# Patient Record
Sex: Male | Born: 1990 | Race: Black or African American | Hispanic: No | Smoking: Never smoker
Health system: Southern US, Community
[De-identification: ages and names within clinical notes are randomized; demographics above are authoritative.]

## PROBLEM LIST (undated history)

## (undated) DIAGNOSIS — B1910 Unspecified viral hepatitis B without hepatic coma: Secondary | ICD-10-CM

## (undated) HISTORY — PX: HIP SURGERY: SHX245

---
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
## 2009-10-17 ENCOUNTER — Emergency Department (HOSPITAL_COMMUNITY): Admission: EM | Admit: 2009-10-17 | Discharge: 2009-10-17 | Payer: Self-pay | Admitting: Emergency Medicine

## 2010-07-08 NOTE — Op Note (Signed)
Ernest Moore, Ernest Moore               ACCOUNT NO.:  [id number]   MEDICAL RECORD NO.:  [id number]          PATIENT TYPE:  INP   LOCATION:  6126                         FACILITY:  [facility]   PHYSICIAN:  Lubertha Basque. Dalldorf, M.D.DATE OF BIRTH:  01/18/1991   DATE OF PROCEDURE:  11/25/2003  DATE OF DISCHARGE:                                 OPERATIVE REPORT   PREOPERATIVE DIAGNOSIS:  Right hip infection.   POSTOPERATIVE DIAGNOSIS:  Right hip infection.   PROCEDURE:  Right hip irrigation and debridement.   ANESTHESIA:  General.   ATTENDING SURGEON:  Lubertha Basque. Jerl Santos, M.D.   ASSISTANT:  Lindwood Qua, P.A.   INDICATION FOR PROCEDURE:  The patient is a 20 year old boy who immigrated  from [place] about one year ago.  He presented yesterday with a 1 day history  of severe hip pain.  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  I attempted an aspiration but was unsuccessful.  He is scheduled  now for irrigation and debridement and biopsy of this area.  Informed  operative consent was obtained from the patient's father after discussion of  possible complications and reaction to anesthesia, continued infection, and  neurovascular injury.  The possibility this might represent a neoplastic  process was also discussed.   DESCRIPTION OF PROCEDURE:  The patient was taken to the operating suite  where general anesthetic was applied without difficulty.  He was positioned  in the lateral decubitus position with the right hip up.  All bony  prominences were appropriately padded, an axillary roll was placed, and hip  positioners were utilized.  He was then prepped and draped in the normal  sterile fashion.  A lateral approach was taken to the right hip.  A  longitudinal incision was made just anterior to the greater trochanter.  Dissection was  carried down to the gluteus medius and IT band through which  a longitudinal incision was made.  These structures were retracted in an  anterior direction.  We also went through the gluteus minimus to expose the  hip capsule.  The capsule was then released off of the femoral neck and a  large amount of tan fluid emanated.  This was cultured and sent to  microbiology.  We then exposed the cyst on the anteroinferior aspect of the  femoral head.  With a curet I was able to remove the sequestrum and this was  sent to pathology.  The wound was then irrigated with pulsatile lavage and a  syringe, running several liters of fluid through the joint.  A large Hemovac  drain was placed exiting distally.  The capsule was repaired loosely with  Vicryl followed by reapproximation of the gluteus minimus and the fascial  layer with #1 Vicryl in interrupted fashion.  Subcutaneous tissue was  reapproximated with 2-0 undyed Vicryl and skin was closed with staples.  Adaptic was applied to the wound after some  Marcaine was injected.  Dry  gauze and tape were applied.  Estimated __________ was taken from anesthesia  records.   DISPOSITION:  The patient was extubated in the operating room and taken to  the recovery room in stable condition.  Plans were for him to be admitted  back to the pediatric service.  He received some IV Ancef after cultures had  been taken and he will continue on his antibiotic as we monitor the culture  results.       PGD/MEDQ  D:  11/25/2003  T:  11/25/2003  Job:  [id number]

## 2010-07-08 NOTE — Discharge Summary (Signed)
NAMECAS, TRACZ               ACCOUNT NO.:  [id number]   MEDICAL RECORD NO.:  [id number]          PATIENT TYPE:  INP   LOCATION:  6126                         FACILITY:  [facility]   PHYSICIAN:  Orie Rout, M.D.DATE OF BIRTH:  [birth year]   DATE OF ADMISSION:  11/24/2003  DATE OF DISCHARGE:  12/01/2003                                 DISCHARGE SUMMARY   PRIMARY CARE PHYSICIAN:  Dr. Lubertha South at [facility]   DISCHARGE DIAGNOSES:  Tuberculosis osteomyelitis.   PROCEDURES:  1.  I&D of proximal right femur on November 25, 2003.  2.  Hip film on November 24, 2003 shows a lytic lesion in the metaphysis of      the proximal right femur.  3.  CT scan of the right hip on November 24, 2003 shows a lytic lesion in the      right femoral metaphysis with destruction of the cortex consistent with      possible Buddy's abscess.   LABORATORIES:  AF[facility] culture with smear.  No acid-fast bacilli seen.  However,  sensitivities are pending at this time.  A right hip aspirate from the joint  grew coag-negative Staph consistent with contamination.  Operative cultures  of the right hip show no organisms.  Blood cultures grew no organisms.  Urine cultures grew no organisms.  There is an HIV test pending at the time  of discharge.   HISTORY AND PHYSICAL:  For complete history and physical, please see the  documented  H&P on the chart, but in short, patient is a 20 year old African-  American gentleman who presented with acute onset of right hip pain.  Was  brought to her primary care physician who referred her to the emergency  department where an x-ray revealed a lytic lesion in the proximal right  femur confirmed by CT.  Orthopedics was consulted and an I&D of the right  hip was performed on November 30, 2003.   HOSPITAL COURSE:  I&D was performed on November 30, 2003 and the hip fluid  was sent for cultures.  Also, blood cultures and urine cultures were sent.  These were all negative at the time  of discharge.  However, there is an AFB  hip culture still pending and will be official in six weeks.  Surgical  pathology specimens were sent which showed caseating granulomatous  inflammation, positive acid-fast bacilli seen.  This was consistent with TB  osteomyelitis.  The TB nurse was contacted and the patient was started on  rifampin, isoniazid, ethambutol, and pyrazinamide.  He will continue this  for a course of nine months.  He will need periodic monitoring of his LFTs.  He will also need to have his AFB culture of the right hip fluid checked in  approximately six weeks and at that time potentially if the organism is not  resistant, some of the antibiotics can be tapered off.  Patient was  discharged home on RIPE therapy of rifampin, isoniazid, pyrazinamide, and  ethambutol and the TB health department nurse was contacted and she will  arrange directly observed therapy and also test his close  contacts.   DISCHARGE MEDICATIONS:  1.  Tylenol No.3 one to two q.4h. p.r.n. pain in the right hip.  2.  Rifampin 600 mg one p.o. daily.  3.  Isoniazid 300 mg one p.o. daily.  4.  Pyrazinamide 1000 mg one p.o. daily.  5.  Ethambutol 800 mg one p.o. daily.   SPECIAL INSTRUCTIONS:  Patient was instructed to follow up with Dr. Lubertha South at  [facility] on October 13 at 3 p.m.  We would appreciate if Dr.  Lubertha South would monitor his LFTs on the TB therapy.  We would also appreciate if  Dr. Lubertha South would follow up his wound cultures in approximately six weeks and  taper any antibiotics that are no longer necessary once sensitivities have  been determined in that culture.  That culture is the culture taken on  October 5 and is listed in the computer system as culture AFB with smear.       WTP/MEDQ  D:  12/01/2003  T:  12/01/2003  Job:  [id number]   cc:   Rafael Capo Bing. Prose, M.D.  [address]  [place]  [place] [address]  Fax: [phone]

## 2010-10-20 ENCOUNTER — Inpatient Hospital Stay (INDEPENDENT_AMBULATORY_CARE_PROVIDER_SITE_OTHER)
Admission: RE | Admit: 2010-10-20 | Discharge: 2010-10-20 | Disposition: A | Discharge status: Home / Self Care | Payer: Self-pay | Source: Ambulatory Visit | Attending: Family Medicine | Admitting: Family Medicine

## 2010-10-20 DIAGNOSIS — A64 Unspecified sexually transmitted disease: Secondary | ICD-10-CM

## 2012-03-22 ENCOUNTER — Encounter (HOSPITAL_COMMUNITY): Payer: Self-pay | Admitting: Emergency Medicine

## 2012-03-22 ENCOUNTER — Emergency Department (HOSPITAL_COMMUNITY)
Admission: EM | Admit: 2012-03-22 | Discharge: 2012-03-22 | Disposition: A | Discharge status: Home / Self Care | Payer: Self-pay | Attending: Emergency Medicine | Admitting: Emergency Medicine

## 2012-03-22 DIAGNOSIS — J3489 Other specified disorders of nose and nasal sinuses: Secondary | ICD-10-CM | POA: Insufficient documentation

## 2012-03-22 DIAGNOSIS — R059 Cough, unspecified: Secondary | ICD-10-CM | POA: Insufficient documentation

## 2012-03-22 DIAGNOSIS — J069 Acute upper respiratory infection, unspecified: Secondary | ICD-10-CM | POA: Insufficient documentation

## 2012-03-22 DIAGNOSIS — R040 Epistaxis: Secondary | ICD-10-CM | POA: Insufficient documentation

## 2012-03-22 DIAGNOSIS — R05 Cough: Secondary | ICD-10-CM | POA: Insufficient documentation

## 2012-03-22 NOTE — ED Provider Notes (Signed)
History  This chart was scribed for non-physician practitioner working with Ernest Lennert, MD by Ardeen Jourdain, ED Scribe. This patient was seen in room TR07C/TR07C and the patient's care was started at 2258.  CSN: [id number]  Arrival date & time 03/22/12  2248   First MD Initiated Contact with Patient 03/22/12 2258      Chief Complaint  Patient presents with  . Epistaxis     The history is provided by the patient. No language interpreter was used.    Ernest Moore is a 22 y.o. male who presents to the Emergency Department complaining of intermittent episodes of epistaxis that began this morning. He states he had 2 episodes today with the first one lasting 5 minutes and the last one 10 minutes. He states he has had recent fever, cough, congestion and rhinorrhea recently. Pt denies HA, weakness, numbness, lightheadedness, bruising and rash as associated symptoms.  He states he has been blowing his nose more than normally. He states his last episode of epistaxis began after blowing his nose. He denies putting anything into his nose such as tissues. He denies being around any chemicals at his job. He denies any pertinent or chronic medical conditions.    History reviewed. No pertinent past medical history.  Past Surgical History  Procedure Date  . Hip surgery     No family history on file.  History  Substance Use Topics  . Smoking status: Never Smoker   . Smokeless tobacco: Not on file  . Alcohol Use: No      Review of Systems  HENT: Positive for congestion and rhinorrhea.   Respiratory: Positive for cough. Negative for shortness of breath.   Gastrointestinal: Negative for nausea, vomiting and diarrhea.  Skin: Negative for color change and rash.  Neurological: Negative for dizziness, weakness and light-headedness.  All other systems reviewed and are negative.    Allergies  Review of patient's allergies indicates no known allergies.  Home Medications  No current  outpatient prescriptions on file.  Triage Vitals: BP 133/78  Pulse 60  Temp 98.3 F (36.8 C)  Resp 12  SpO2 96%  Physical Exam  Nursing note and vitals reviewed. Constitutional: He is oriented to person, place, and time. He appears well-developed and well-nourished. No distress.  HENT:  Head: Normocephalic and atraumatic.       Dry blood in left nostril, no active bleeding, after swab small amount of bleeding that is easily controlled from anterior septal area. No septal hematoma, no septal perforation, no polyps   Eyes: EOM are normal. Pupils are equal, round, and reactive to light.  Cardiovascular: Normal rate, regular rhythm and normal heart sounds.   No murmur heard. Pulmonary/Chest: Effort normal and breath sounds normal. No respiratory distress.  Abdominal: Soft. He exhibits no distension.  Neurological: He is alert and oriented to person, place, and time.  Skin: Skin is warm and dry.  Psychiatric: He has a normal mood and affect. His behavior is normal.    ED Course  Procedures   DIAGNOSTIC STUDIES: Oxygen Saturation is 96% on room air, adequate by my interpretation.    COORDINATION OF CARE:  11:08 PM: Discussed treatment plan which includes home treatment for congestion and advise to avoid another episode of epistaxis with pt at bedside and pt agreed to plan.     Labs Reviewed - No data to display No results found.   No diagnosis found.    MDM  No active bleeding.  No other concerning findings.  Pt stable for d/c home.     I personally performed the services described in this documentation, which was scribed in my presence. The recorded information has been reviewed and is accurate.    Angus Seller, [place] 03/22/12 [phone]

## 2012-03-22 NOTE — ED Notes (Signed)
Reports nosebleed x 10 min.  States he also had a nosebleed this morning.

## 2012-03-25 NOTE — ED Provider Notes (Signed)
Medical screening examination/treatment/procedure(s) were performed by non-physician practitioner and as supervising physician I was immediately available for consultation/collaboration.   Millicent Blazejewski L Kerriann Kamphuis, MD 03/25/12 1445 

## 2013-02-16 ENCOUNTER — Encounter (HOSPITAL_COMMUNITY): Payer: Self-pay | Admitting: Emergency Medicine

## 2013-02-16 ENCOUNTER — Emergency Department (HOSPITAL_COMMUNITY)
Admission: EM | Admit: 2013-02-16 | Discharge: 2013-02-16 | Disposition: A | Discharge status: Home / Self Care | Payer: Self-pay | Attending: Emergency Medicine | Admitting: Emergency Medicine

## 2013-02-16 DIAGNOSIS — Y9389 Activity, other specified: Secondary | ICD-10-CM | POA: Insufficient documentation

## 2013-02-16 DIAGNOSIS — Y9229 Other specified public building as the place of occurrence of the external cause: Secondary | ICD-10-CM | POA: Insufficient documentation

## 2013-02-16 DIAGNOSIS — T1590XA Foreign body on external eye, part unspecified, unspecified eye, initial encounter: Secondary | ICD-10-CM | POA: Insufficient documentation

## 2013-02-16 DIAGNOSIS — H5789 Other specified disorders of eye and adnexa: Secondary | ICD-10-CM

## 2013-02-16 DIAGNOSIS — Z8619 Personal history of other infectious and parasitic diseases: Secondary | ICD-10-CM | POA: Insufficient documentation

## 2013-02-16 HISTORY — DX: Unspecified viral hepatitis B without hepatic coma: B19.10
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
## 2013-02-17 NOTE — ED Provider Notes (Signed)
Medical screening examination/treatment/procedure(s) were performed by non-physician practitioner and as supervising physician I was immediately available for consultation/collaboration.  EKG Interpretation   None         Darrious Youman T Kelwin Gibler, MD 02/17/13 0802 

## 2013-02-19 ENCOUNTER — Other Ambulatory Visit: Payer: Self-pay | Admitting: Gastroenterology

## 2013-02-19 DIAGNOSIS — B181 Chronic viral hepatitis B without delta-agent: Secondary | ICD-10-CM

## 2013-02-26 ENCOUNTER — Ambulatory Visit
Admission: RE | Admit: 2013-02-26 | Discharge: 2013-02-26 | Disposition: A | Discharge status: Home / Self Care | Payer: Managed Care, Other (non HMO) | Source: Ambulatory Visit | Attending: Gastroenterology | Admitting: Gastroenterology

## 2013-02-26 DIAGNOSIS — B181 Chronic viral hepatitis B without delta-agent: Secondary | ICD-10-CM

## 2013-03-24 ENCOUNTER — Other Ambulatory Visit (HOSPITAL_COMMUNITY): Payer: Self-pay | Admitting: Gastroenterology

## 2013-03-24 DIAGNOSIS — K7460 Unspecified cirrhosis of liver: Secondary | ICD-10-CM

## 2013-03-25 ENCOUNTER — Other Ambulatory Visit: Payer: Self-pay | Admitting: Radiology

## 2013-03-26 ENCOUNTER — Encounter (HOSPITAL_COMMUNITY): Payer: Self-pay | Admitting: Pharmacy Technician

## 2013-03-28 ENCOUNTER — Ambulatory Visit (HOSPITAL_COMMUNITY)
Admission: RE | Admit: 2013-03-28 | Discharge: 2013-03-28 | Disposition: A | Discharge status: Home / Self Care | Payer: PRIVATE HEALTH INSURANCE | Source: Ambulatory Visit | Attending: Gastroenterology | Admitting: Gastroenterology

## 2013-03-28 ENCOUNTER — Encounter (HOSPITAL_COMMUNITY): Payer: Self-pay

## 2013-03-28 DIAGNOSIS — B181 Chronic viral hepatitis B without delta-agent: Secondary | ICD-10-CM | POA: Insufficient documentation

## 2013-03-28 DIAGNOSIS — K7460 Unspecified cirrhosis of liver: Secondary | ICD-10-CM

## 2013-03-28 LAB — CBC
HCT: 41.3 % (ref 39.0–52.0)
Hemoglobin: 15 g/dL (ref 13.0–17.0)
MCH: 30.8 pg (ref 26.0–34.0)
MCHC: 36.3 g/dL — ABNORMAL HIGH (ref 30.0–36.0)
MCV: 84.8 fL (ref 78.0–100.0)
Platelets: 79 10*3/uL — ABNORMAL LOW (ref 150–400)
RBC: 4.87 MIL/uL (ref 4.22–5.81)
RDW: 11.8 % (ref 11.5–15.5)
WBC: 3.4 10*3/uL — ABNORMAL LOW (ref 4.0–10.5)

## 2013-03-28 LAB — APTT: aPTT: 36 seconds (ref 24–37)

## 2013-03-28 LAB — PROTIME-INR
INR: 1.15 (ref 0.00–1.49)
Prothrombin Time: 14.5 seconds (ref 11.6–15.2)

## 2013-03-28 MED ORDER — FENTANYL CITRATE 0.05 MG/ML IJ SOLN
INTRAMUSCULAR | Status: AC | PRN
  Administered 2013-03-28: 50 ug via INTRAVENOUS

## 2013-03-28 MED ORDER — MIDAZOLAM HCL 2 MG/2ML IJ SOLN
INTRAMUSCULAR | Status: AC
  Filled 2013-03-28: qty 6

## 2013-03-28 MED ORDER — HYDROCODONE-ACETAMINOPHEN 5-325 MG PO TABS
1.0000 | ORAL_TABLET | ORAL | Status: DC | PRN
  Filled 2013-03-28: qty 2

## 2013-03-28 MED ORDER — MIDAZOLAM HCL 2 MG/2ML IJ SOLN
INTRAMUSCULAR | Status: AC | PRN
  Administered 2013-03-28 (×2): 1 mg via INTRAVENOUS

## 2013-03-28 MED ORDER — FENTANYL CITRATE 0.05 MG/ML IJ SOLN
INTRAMUSCULAR | Status: AC
  Filled 2013-03-28: qty 6

## 2013-03-28 MED ORDER — SODIUM CHLORIDE 0.9 % IV SOLN
INTRAVENOUS | Status: DC
  Administered 2013-03-28: 09:00:00 via INTRAVENOUS

## 2013-03-28 NOTE — Discharge Instructions (Signed)
Liver Biopsy °Care After °Refer to this sheet in the next few weeks. These discharge instructions provide you with general information on caring for yourself after you leave the hospital. Your caregiver may also give you specific instructions. Your treatment has been planned according to the most current medical practices available, but unavoidable complications sometimes occur. If you have any problems or questions after discharge, please call your caregiver. °HOME CARE INSTRUCTIONS  °· You should rest for 1 to 2 days or as instructed. °· If you go home the same day as your procedure (outpatient), have a responsible adult take you home and stay with you overnight. °· Do not lift more than 5 pounds or play contact sports for 2 weeks. °· Do not drive for 24 hours after this test. °· Do not take medicine containing aspirin or drink alcohol for 1 week after this test. °· Change bandages (dressings) as directed. °· Only take over-the-counter or prescription medicines for pain, discomfort, or fever as directed by your caregiver. °OBTAINING YOUR TEST RESULTS °Not all test results are available during your visit. If your test results are not back during the visit, make an appointment with your caregiver to find out the results. Do not assume everything is normal if you have not heard from your caregiver or the medical facility. It is important for you to follow up on all of your test results. °SEEK MEDICAL CARE IF:  °· You have increased bleeding (more than a small spot) from the biopsy site. °· You have redness, swelling, or increasing pain in the biopsy site. °· You have an oral temperature above 102° F (38.9° C). °SEEK IMMEDIATE MEDICAL CARE IF:  °· You develop swelling or pain in the belly (abdomen). °· You develop a rash. °· You have difficulty breathing, feel short of breath, or feel faint. °· You develop any reaction or side effects to medicines given. °MAKE SURE YOU:  °· Understand these instructions. °· Will watch  your condition. °· Will get help right away if you are not doing well or get worse. °Document Released: 08/26/2004 Document Revised: 05/01/2011 Document Reviewed: 09/19/2007 °ExitCare® Patient Information ©2014 [employer]. °Moderate Sedation, Adult °Moderate sedation is given to help you relax or even sleep through a procedure. You may remain sleepy, be clumsy, or have poor balance for several hours following this procedure. Arrange for a responsible adult, family member, or friend to take you home. A responsible adult should stay with you for at least 24 hours or until the medicines have worn off. °· Do not participate in any activities where you could become injured for the next 24 hours, or until you feel normal again. Do not: °· Drive. °· Swim. °· Ride a bicycle. °· Operate heavy machinery. °· Cook. °· Use power tools. °· Climb ladders. °· Work at heights. °· Do not make important decisions or sign legal documents until you are improved. °· Vomiting may occur if you eat too soon. When you can drink without vomiting, try water, juice, or soup. Try solid foods if you feel little or no nausea. °· Only take over-the-counter or prescription medications for pain, discomfort, or fever as directed by your caregiver.If pain medications have been prescribed for you, ask your caregiver how soon it is safe to take them. °· Make sure you and your family fully understands everything about the medication given to you. Make sure you understand what side effects may occur. °· You should not drink alcohol, take sleeping pills, or medications that   cause drowsiness for at least 24 hours. °· If you smoke, do not smoke alone. °· If you are feeling better, you may resume normal activities 24 hours after receiving sedation. °· Keep all appointments as scheduled. Follow all instructions. °· Ask questions if you do not understand. °SEEK MEDICAL CARE IF:  °· Your skin is pale or bluish in color. °· You continue to feel sick to your  stomach (nauseous) or throw up (vomit). °· Your pain is getting worse and not helped by medication. °· You have bleeding or swelling. °· You are still sleepy or feeling clumsy after 24 hours. °SEEK IMMEDIATE MEDICAL CARE IF:  °· You develop a rash. °· You have difficulty breathing. °· You develop any type of allergic problem. °· You have a fever. °Document Released: 11/01/2000 Document Revised: 05/01/2011 Document Reviewed: 10/14/2012 °ExitCare® Patient Information ©2014 [employer]. ° °

## 2013-03-28 NOTE — Procedures (Signed)
Successful US guided random liver core biopsy No complications.  Brayton ElKevin Lashana Spang PA-C Interventional Radiology 03/28/2013 11:17 AM

## 2013-03-28 NOTE — H&P (Signed)
Agree 

## 2013-03-28 NOTE — Progress Notes (Signed)
Patient ready for discharge at 2:15 pm unable to get ride to show up and pick up patient after numerous calls.

## 2013-03-28 NOTE — H&P (Signed)
Ernest Moore is an 23 y.o. male.   Chief Complaint: hepatitis B HPI: Patient with history of hepatitis B presents today for US guided random liver biopsy.  Past Medical History  Diagnosis Date  . Hepatitis B   prior TB, Chlamydia (treated)  Past Surgical History  Procedure Laterality Date  . Hip surgery      History reviewed. No pertinent family history. Social History:  reports that he has never smoked. He does not have any smokeless tobacco history on file. He reports that he drinks alcohol. He reports that he does not use illicit drugs.  Allergies: No Known Allergies  Current outpatient prescriptions:ibuprofen (ADVIL,MOTRIN) 200 MG tablet, Take 400 mg by mouth every 6 (six) hours as needed for mild pain or moderate pain., Disp: , Rfl:  Current facility-administered medications:0.9 %  sodium chloride infusion, , Intravenous, Continuous, Brayton ElKevin Bruning, PA-C, Last Rate: 20 mL/hr at 03/28/13 [id number]  03/28/13 labs pending Review of Systems  Constitutional: Negative for fever and chills.  HENT:       Occ HA's  Respiratory: Negative for cough and shortness of breath.   Cardiovascular: Negative for chest pain.  Gastrointestinal: Negative for nausea, vomiting and abdominal pain.  Musculoskeletal: Negative for back pain.  Endo/Heme/Allergies: Does not bruise/bleed easily.    Blood pressure 116/59, pulse 66, temperature 98.1 F (36.7 C), temperature source Oral, resp. rate 16, height 5\' 7"  (1.702 m), weight 137 lb (62.143 kg), SpO2 100.00%. Physical Exam  Constitutional: He is oriented to person, place, and time. He appears well-developed and well-nourished.  Cardiovascular: Normal rate and regular rhythm.   Respiratory: Effort normal and breath sounds normal.  GI: Soft. Bowel sounds are normal. There is no tenderness.  Musculoskeletal: Normal range of motion. He exhibits no edema.  Neurological: He is alert and oriented to person, place, and time.     Assessment/Plan Patient with  history of hepatitis B presents today for US guided random liver biopsy. Details/risks of procedure d/w pt/girlfriend with their understanding and consent.   Ernest Moore,D KEVIN 03/28/2013, 9:36 AM

## 2014-07-01 ENCOUNTER — Other Ambulatory Visit: Payer: Self-pay | Admitting: Gastroenterology

## 2014-07-01 DIAGNOSIS — B181 Chronic viral hepatitis B without delta-agent: Secondary | ICD-10-CM

## 2014-07-10 ENCOUNTER — Ambulatory Visit (HOSPITAL_COMMUNITY): Payer: 59

## 2014-07-13 ENCOUNTER — Ambulatory Visit (HOSPITAL_COMMUNITY)
Admission: RE | Admit: 2014-07-13 | Discharge: 2014-07-13 | Disposition: A | Discharge status: Home / Self Care | Payer: 59 | Source: Ambulatory Visit | Attending: Gastroenterology | Admitting: Gastroenterology

## 2014-07-13 DIAGNOSIS — B181 Chronic viral hepatitis B without delta-agent: Secondary | ICD-10-CM | POA: Insufficient documentation

## 2016-04-20 IMAGING — US US ABDOMEN COMPLETE
1 series · 13 of 25 positions shown · non-contrast
Comparison: Abdominal ultrasound February 26, 2013

CLINICAL DATA: Chronic hepatitis-B, follow-up study

EXAM:
ULTRASOUND ABDOMEN COMPLETE

[Series 1: us abdomen complete · 0.16mm/px · 13 of 90 slices shown]
[im 1/90]
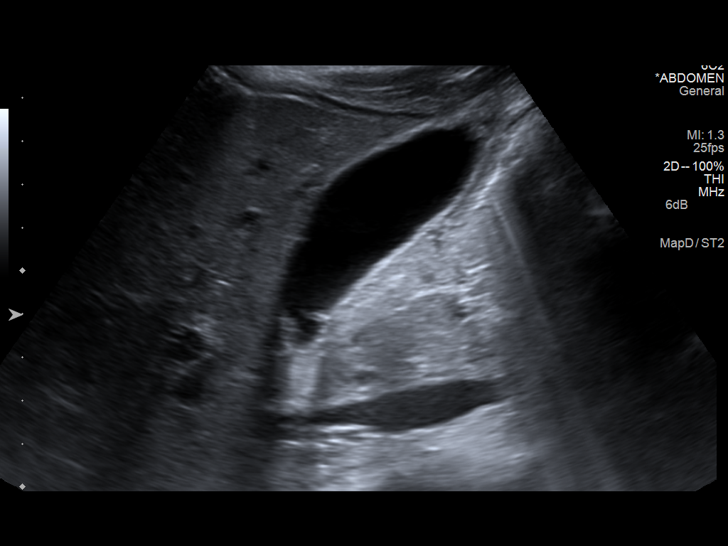
[im 8/90]
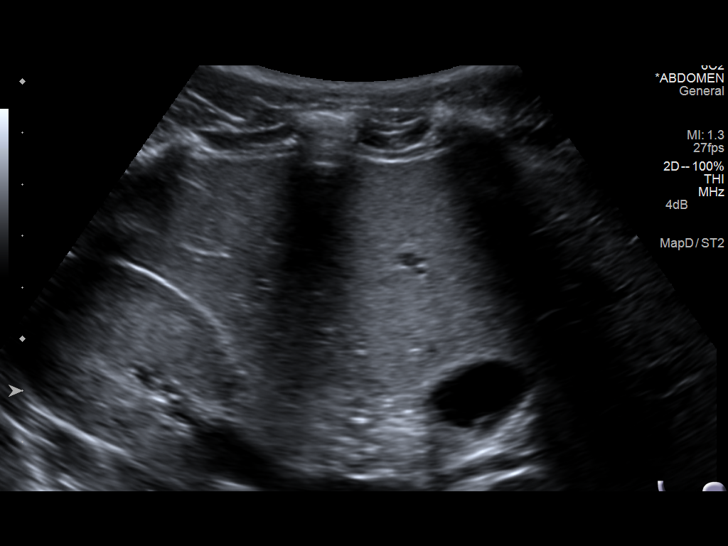
[im 15/90]
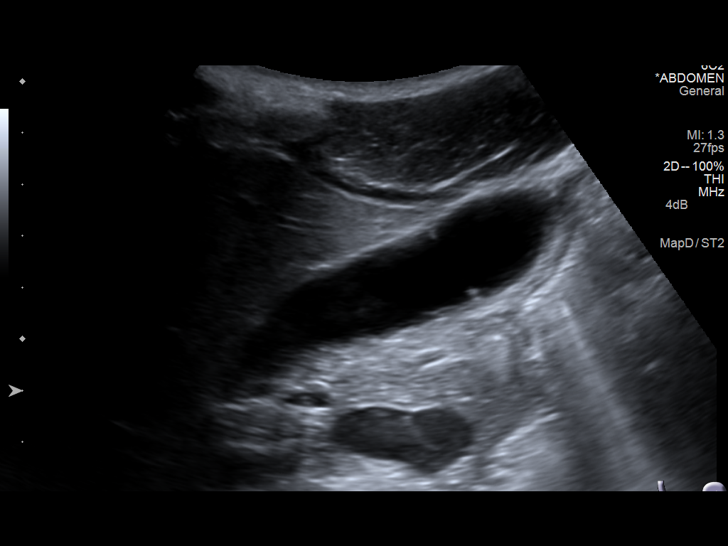
[im 23/90]
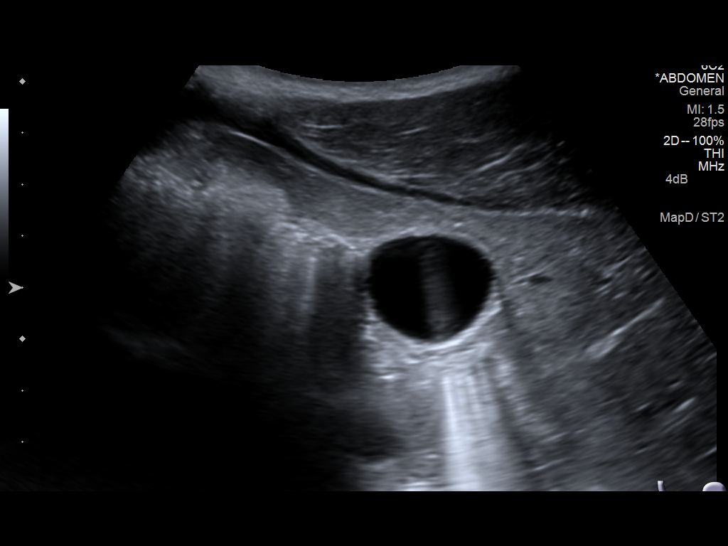
[im 30/90]
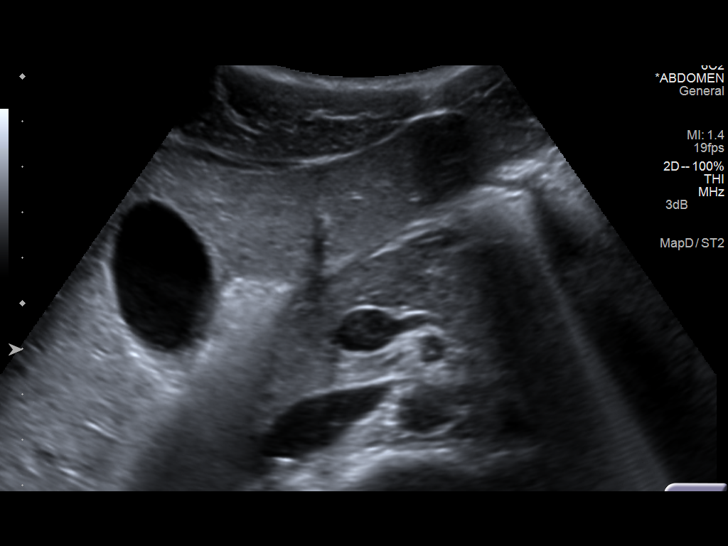
[im 38/90]
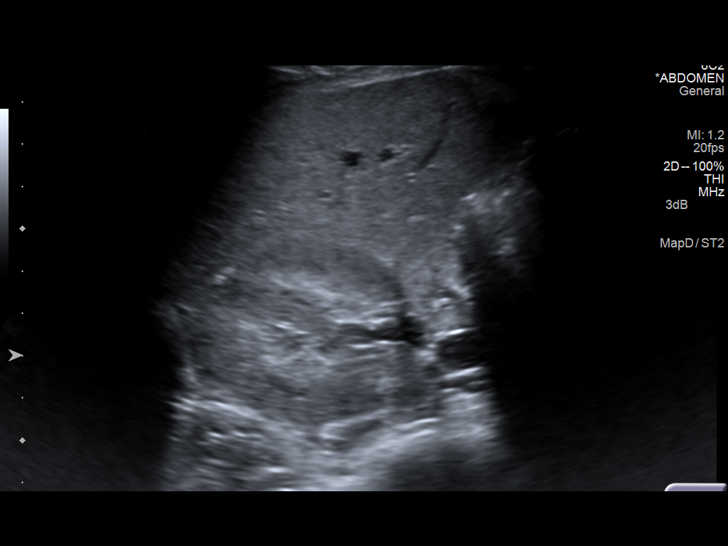
[im 45/90]
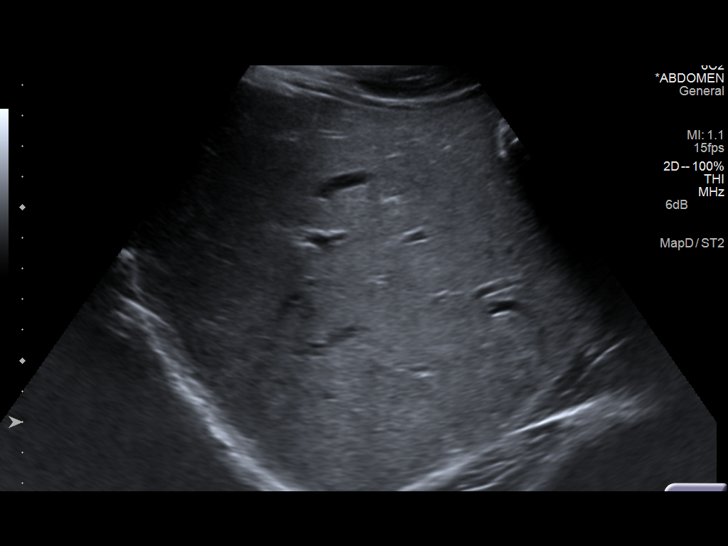
[im 52/90]
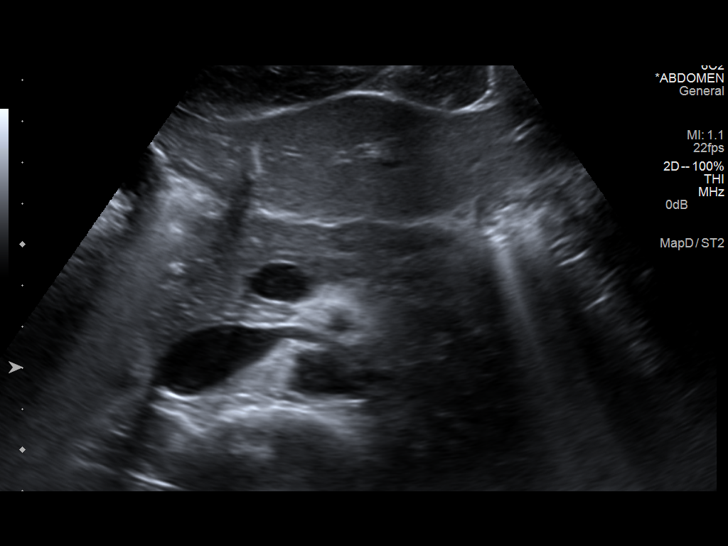
[im 60/90]
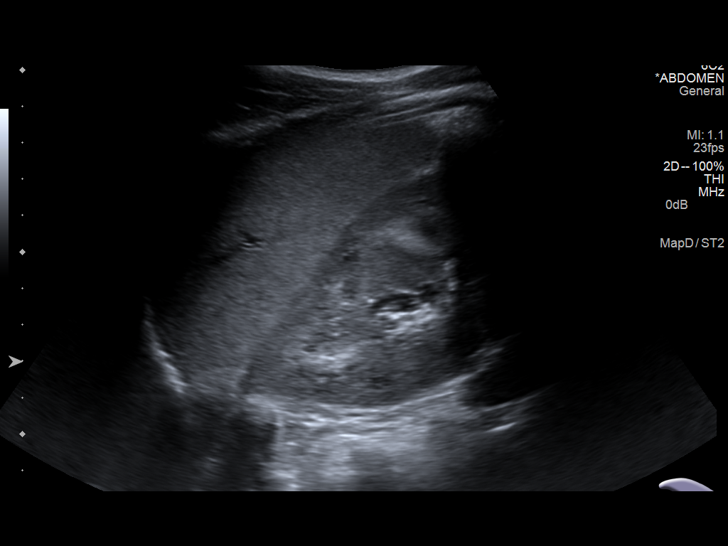
[im 67/90]
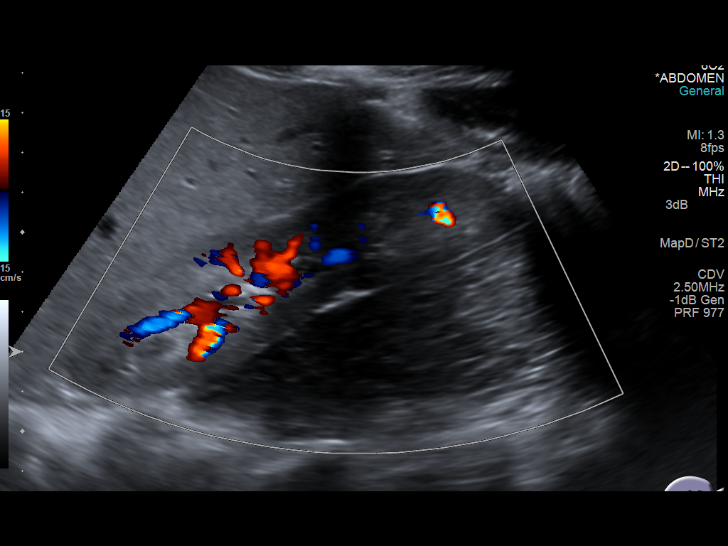
[im 75/90]
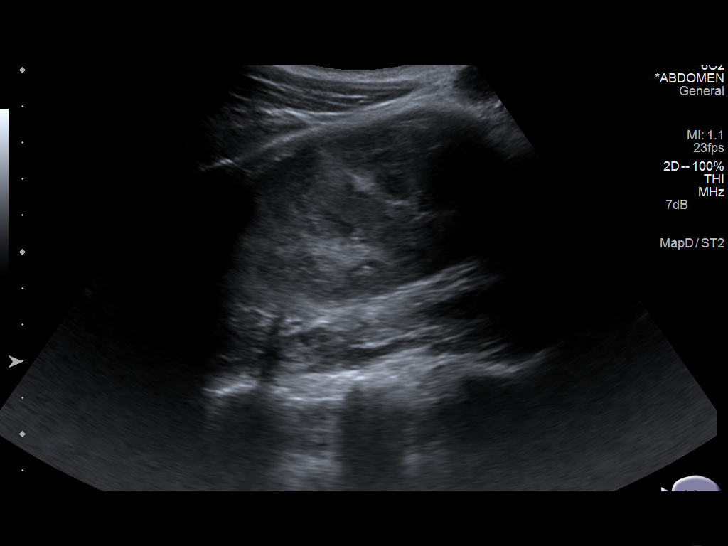
[im 82/90]
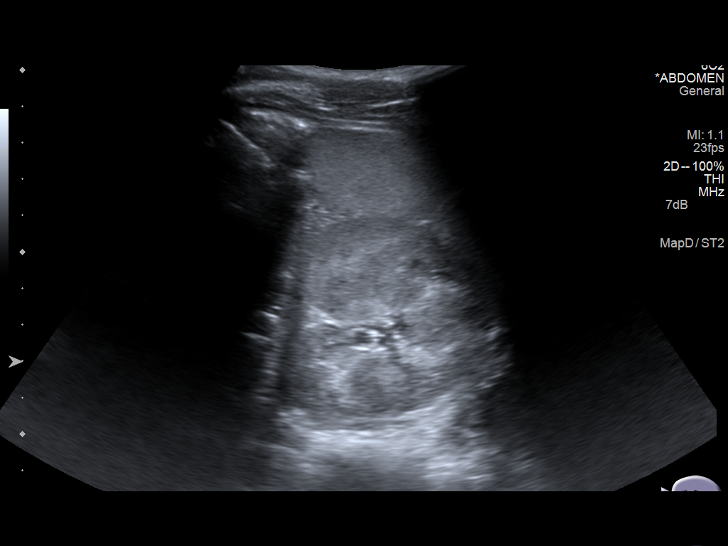
[im 90/90]
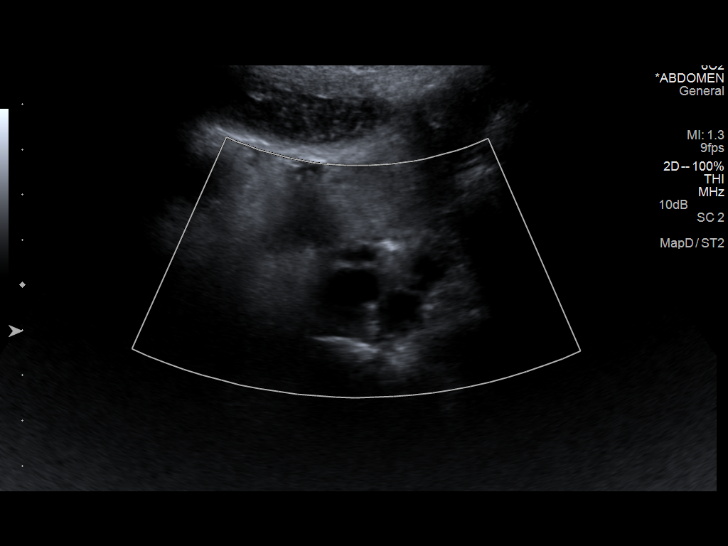

[13 of 25 positions shown; findings below may reference images not displayed]

FINDINGS: Gallbladder: There is echogenic material within the gallbladder.
Discrete shadowing stones are not demonstrated. There may be a tiny
polyp near the fundus. There is no wall thickening, pericholecystic
fluid, or positive sonographic Murphy's sign.

Common bile duct: Diameter: 3.8 mm

Liver: The liver exhibits normal echotexture with no focal mass nor
ductal dilation. The surface contour is normal where visualized.

IVC: No abnormality visualized.

Pancreas: Visualized portion unremarkable.

Spleen: Size and appearance within normal limits.

Right Kidney: Length: 12.5 cm. Echogenicity within normal limits. No
mass or hydronephrosis visualized.

Left Kidney: Length: 12.8 cm. Echogenicity within normal limits. No
mass or hydronephrosis visualized.

Abdominal aorta: No aneurysm visualized.

Other findings: There is no ascites.
IMPRESSION: 1. Echogenic bile or sludge within the gallbladder without evidence
of acute cholecystitis.
2. Normal appearance of the liver and spleen.
3. No acute abnormality is demonstrated elsewhere within the
abdomen.

## 2017-07-04 ENCOUNTER — Other Ambulatory Visit: Payer: Self-pay | Admitting: Gastroenterology

## 2017-07-04 DIAGNOSIS — B181 Chronic viral hepatitis B without delta-agent: Secondary | ICD-10-CM

## 2018-03-20 ENCOUNTER — Emergency Department (HOSPITAL_COMMUNITY)
Admission: EM | Admit: 2018-03-20 | Discharge: 2018-03-20 | Disposition: A | Discharge status: Home / Self Care | Payer: 59 | Attending: Emergency Medicine | Admitting: Emergency Medicine

## 2018-03-20 ENCOUNTER — Encounter (HOSPITAL_COMMUNITY): Payer: Self-pay | Admitting: Emergency Medicine

## 2018-03-20 DIAGNOSIS — Z79899 Other long term (current) drug therapy: Secondary | ICD-10-CM | POA: Insufficient documentation

## 2018-03-20 DIAGNOSIS — R040 Epistaxis: Secondary | ICD-10-CM | POA: Insufficient documentation

## 2018-03-20 MED ORDER — OXYMETAZOLINE HCL 0.05 % NA SOLN
1.0000 | Freq: Once | NASAL | Status: AC
  Administered 2018-03-20: 1 via NASAL
  Filled 2018-03-20: qty 15

## 2018-03-20 NOTE — ED Provider Notes (Signed)
[facility] Note   CSN: [id number] Arrival date & time: 03/20/18  1111     History   Chief Complaint Chief Complaint  Patient presents with  . Epistaxis    HPI Ernest Moore is a 28 y.o. male.  The history is provided by the patient. No language interpreter was used.  Epistaxis   Ernest BastosMohamed Rasco is a 28 y.o. male who presents to the Emergency Department complaining of epistaxis. Presents the emergency department complaining of epistaxis that began this morning. He has been ill recently with subjective fevers, cough and congestion. This morning when he blew his nose he had bleeding from the left side his nose. He was referred to the emergency department by his employer for clearance to go back to work. Denies any additional easy bruising or bleeding. No hematuria, hematochezia, hemoptysis. He has a history of hepatitis B, no additional medical problems. He takes medications and has no allergies to medications. Past Medical History:  Diagnosis Date  . Hepatitis B     Patient Active Problem List   Diagnosis Date Noted  . CERUMEN IMPACTION, LEFT 09/30/2008  . ACUTE NASOPHARYNGITIS 09/30/2008  . CHLAMYDIAL INFECTION 05/18/2008    Past Surgical History:  Procedure Laterality Date  . HIP SURGERY          Home Medications    Prior to Admission medications   Medication Sig Start Date End Date Taking? Authorizing Provider  ibuprofen (ADVIL,MOTRIN) 200 MG tablet Take 400 mg by mouth every 6 (six) hours as needed for mild pain or moderate pain.    [provider]    Family History No family history on file.  Social History Social History   Tobacco Use  . Smoking status: Never Smoker  . Smokeless tobacco: Never Used  Substance Use Topics  . Alcohol use: Yes    Comment: occ  . Drug use: No     Allergies   Patient has no known allergies.   Review of Systems Review of Systems  HENT: Positive for nosebleeds.    All other systems reviewed and are negative.    Physical Exam Updated Vital Signs BP (!) 113/94   Pulse 79   Temp 98.1 F (36.7 C)   Resp 17   SpO2 99%   Physical Exam Vitals signs and nursing note reviewed.  Constitutional:      Appearance: He is well-developed.  HENT:     Head: Normocephalic and atraumatic.     Nose:     Comments: Moderate amount of blood in the left anterior nare.  No blood in the posterior oropharynx. Cardiovascular:     Rate and Rhythm: Normal rate and regular rhythm.     Heart sounds: No murmur.  Pulmonary:     Effort: Pulmonary effort is normal. No respiratory distress.     Breath sounds: Normal breath sounds.  Musculoskeletal:        General: No tenderness.  Skin:    General: Skin is warm and dry.  Neurological:     Mental Status: He is alert and oriented to person, place, and time.  Psychiatric:        Behavior: Behavior normal.      ED Treatments / Results  Labs (all labs ordered are listed, but only abnormal results are displayed) Labs Reviewed - No data to display  EKG None  Radiology No results found.  Procedures .Epistaxis Management Date/Time: 03/20/2018 12:48 PM Performed by: Tilden Fossaees, Oseias Horsey, MD Authorized by: Madilyn Hookees,  Lanora ManisElizabeth, MD   Consent:    Consent obtained:  Verbal   Consent given by:  Patient   Risks discussed:  Bleeding, infection, nasal injury and pain Anesthesia (see MAR for exact dosages):    Anesthesia method:  None Procedure details:    Treatment site:  L anterior   Treatment method:  Silver nitrate   Treatment complexity:  Limited   Treatment episode: initial   Post-procedure details:    Assessment:  Bleeding stopped   Patient tolerance of procedure:  Tolerated well, no immediate complications   (including critical care time)  Medications Ordered in ED Medications  oxymetazoline (AFRIN) 0.05 % nasal spray 1 spray (1 spray Each Nare Given 03/20/18 1205)     Initial Impression / Assessment and  Plan / ED Course  I have reviewed the triage vital signs and the nursing notes.  Pertinent labs & imaging results that were available during my care of the patient were reviewed by me and considered in my medical decision making (see chart for details).     Patient with recent URI symptoms here for evaluation of left sided epistaxis. He did have epistaxis after clearing of clot in his nose. This was treated with silver nitrate cautery and Afrin. There was no recurrent bleeding. No historical or exam features concerning for bleeding diathesis. Discussed with patient home care for epistaxis. Discussed outpatient follow-up and return precautions.  Final Clinical Impressions(s) / ED Diagnoses   Final diagnoses:  Left-sided epistaxis    ED Discharge Orders    None       Tilden Fossaees, Niala Stcharles, MD 03/20/18 1249

## 2018-03-20 NOTE — ED Triage Notes (Signed)
Pt with epistaxis, he reports having a cough and cold, he was blowing his nose when it began. He is requesting a work note to return.

## 2019-06-14 ENCOUNTER — Encounter (HOSPITAL_COMMUNITY): Payer: Self-pay | Admitting: *Deleted

## 2019-06-14 ENCOUNTER — Ambulatory Visit (HOSPITAL_COMMUNITY)
Admission: EM | Admit: 2019-06-14 | Discharge: 2019-06-14 | Disposition: A | Discharge status: Home / Self Care | Payer: 59 | Attending: Physician Assistant | Admitting: Physician Assistant

## 2019-06-14 ENCOUNTER — Other Ambulatory Visit: Payer: Self-pay

## 2019-06-14 DIAGNOSIS — R3915 Urgency of urination: Secondary | ICD-10-CM | POA: Diagnosis not present

## 2019-06-14 DIAGNOSIS — R05 Cough: Secondary | ICD-10-CM | POA: Diagnosis not present

## 2019-06-14 DIAGNOSIS — J069 Acute upper respiratory infection, unspecified: Secondary | ICD-10-CM | POA: Diagnosis not present

## 2019-06-14 DIAGNOSIS — Z20822 Contact with and (suspected) exposure to covid-19: Secondary | ICD-10-CM | POA: Diagnosis not present

## 2019-06-14 DIAGNOSIS — R300 Dysuria: Secondary | ICD-10-CM | POA: Diagnosis not present

## 2019-06-14 DIAGNOSIS — N342 Other urethritis: Secondary | ICD-10-CM | POA: Insufficient documentation

## 2019-06-14 LAB — POCT URINALYSIS DIP (DEVICE)
Bilirubin Urine: NEGATIVE
Glucose, UA: NEGATIVE mg/dL
Ketones, ur: NEGATIVE mg/dL
Nitrite: NEGATIVE
Protein, ur: 30 mg/dL — AB
Specific Gravity, Urine: 1.025 (ref 1.005–1.030)
Urobilinogen, UA: 0.2 mg/dL (ref 0.0–1.0)
pH: 6.5 (ref 5.0–8.0)

## 2019-06-14 LAB — SARS CORONAVIRUS 2 (TAT 6-24 HRS): SARS Coronavirus 2: NEGATIVE

## 2019-06-14 MED ORDER — CETIRIZINE HCL 10 MG PO TABS: 10.0000 mg | ORAL_TABLET | Freq: Every day | ORAL | 0 refills | Status: AC

## 2019-06-14 MED ORDER — CEFTRIAXONE SODIUM 500 MG IJ SOLR
500.0000 mg | Freq: Once | INTRAMUSCULAR | Status: AC
  Administered 2019-06-14: 11:00:00 500 mg via INTRAMUSCULAR

## 2019-06-14 MED ORDER — DOXYCYCLINE HYCLATE 100 MG PO CAPS: 100.0000 mg | ORAL_CAPSULE | Freq: Two times a day (BID) | ORAL | 0 refills | Status: AC

## 2019-06-14 MED ORDER — CEFTRIAXONE SODIUM 500 MG IJ SOLR
INTRAMUSCULAR | Status: AC
  Filled 2019-06-14: qty 500

## 2019-06-14 NOTE — Discharge Instructions (Signed)
Take the doxycycline twice a day for 7 days For your nasal signs and symptoms If no improvement in your painful urination in the next 3 to 4 days please return.  If acutely worsening with fever, abdominal pain or back pain please return.  We have sent lab work and a Museum/gallery exhibitions officer.  We will notify you of any positive results and any results requiring change in your treatment.  Otherwise these will be in your MyChart  If your Covid-19 test is positive, you will receive a phone call from [facility] regarding your results. Negative test results are not called. Both positive and negative results area always visible on MyChart. If you do not have a MyChart account, sign up instructions are in your discharge papers.   Persons who are directed to care for themselves at home may discontinue isolation under the following conditions:   At least 10 days have passed since symptom onset and  At least 24 hours have passed without running a fever (this means without the use of fever-reducing medications) and  Other symptoms have improved.  Persons infected with COVID-19 who never develop symptoms may discontinue isolation and other precautions 10 days after the date of their first positive COVID-19 test.

## 2019-06-14 NOTE — ED Triage Notes (Addendum)
C/O dysuria and urinary urgency x 3 days without penile discharge.  Upon further questioning, pt reports cough, bilat eye pruritis over past week.  States has been taking Theraflu.  No known fevers.  States had runny nose, now resolved.  Denies body aches, HA, sore throat.  States received 2nd Covid vaccine 2 days ago.

## 2019-06-14 NOTE — ED Provider Notes (Signed)
[facility]    CSN: [id number] Arrival date & time: 06/14/19  1003      History   Chief Complaint Chief Complaint  Patient presents with  . Dysuria  . Cough    HPI Ernest Moore is a 29 y.o. male.   Patient reports for evaluation of painful urination and increased urinary urgency.  He reports the symptoms have been ongoing for about 1 week.  Reports pain at the end of urination.  He has had a little bit of increase in urgency of urination.  He does report when giving urine sample in clinic that he had a little bit of blood at the end of his urination.  This was a new finding for him.  Denies increased frequency.  Denies abdominal pain.  Denies testicular pain or swelling.  Denies urethral discharge.  Denies any fever or chills.  He reports he is sexually active with one partner.  Patient does have a remote history of chlamydial infection in 2010.  He denies  back pain.  No history of kidney stones.   Patient is also reporting recent slight cough and nasal congestion.  He has had some sneezing and watery eyes as well.  He took some TheraFlu this helped his symptoms.  Denies sore throat, headache, nausea, vomiting, diarrhea.  He does report that he finished his second dose of Covid vaccine 2 days ago.  The symptoms have been present for about 1 week to 10 days.  He does not take an allergy medicine.     Past Medical History:  Diagnosis Date  . Hepatitis B     Patient Active Problem List   Diagnosis Date Noted  . CERUMEN IMPACTION, LEFT 09/30/2008  . ACUTE NASOPHARYNGITIS 09/30/2008  . CHLAMYDIAL INFECTION 05/18/2008    Past Surgical History:  Procedure Laterality Date  . HIP SURGERY         Home Medications    Prior to Admission medications   Medication Sig Start Date End Date Taking? Authorizing Provider  Phenylephrine-Pheniramine-DM [facility] COLD & COUGH PO) Take by mouth.   Yes [provider]  cetirizine (ZYRTEC ALLERGY) 10 MG tablet Take 1  tablet (10 mg total) by mouth daily. 06/14/19   Mayley Lish, Veryl Speak, PA-C  doxycycline (VIBRAMYCIN) 100 MG capsule Take 1 capsule (100 mg total) by mouth 2 (two) times daily for 7 days. 06/14/19 06/21/19  Stavroula Rohde, Veryl Speak, PA-C  ibuprofen (ADVIL,MOTRIN) 200 MG tablet Take 400 mg by mouth every 6 (six) hours as needed for mild pain or moderate pain.    [provider]    Family History Family History  Problem Relation Age of Onset  . Healthy Mother   . Healthy Father     Social History Social History   Tobacco Use  . Smoking status: Never Smoker  . Smokeless tobacco: Never Used  Substance Use Topics  . Alcohol use: Yes    Comment: occasionally  . Drug use: No     Allergies   Patient has no known allergies.   Review of Systems Review of Systems Per HPI  Physical Exam Triage Vital Signs ED Triage Vitals  Enc Vitals Group     BP      Pulse      Resp      Temp      Temp src      SpO2      Weight      Height      Head Circumference  Peak Flow      Pain Score      Pain Loc      Pain Edu?      Excl. in Dent?    No data found.  Updated Vital Signs BP 105/70   Pulse 77   Temp 98.5 F (36.9 C) (Oral)   Resp 16   SpO2 99%   Visual Acuity Right Eye Distance:   Left Eye Distance:   Bilateral Distance:    Right Eye Near:   Left Eye Near:    Bilateral Near:     Physical Exam Vitals and nursing note reviewed.  Constitutional:      Appearance: Normal appearance. He is well-developed.  HENT:     Head: Normocephalic and atraumatic.     Nose:     Comments: Turbinates mildly erythematous bilaterally with clear nasal discharge.    Mouth/Throat:     Mouth: Mucous membranes are moist.     Comments: Some evidence of postnasal drip Eyes:     Conjunctiva/sclera: Conjunctivae normal.     Pupils: Pupils are equal, round, and reactive to light.  Cardiovascular:     Rate and Rhythm: Normal rate and regular rhythm.     Heart sounds: No murmur.  Pulmonary:      Effort: Pulmonary effort is normal. No respiratory distress.     Breath sounds: Normal breath sounds.  Abdominal:     Palpations: Abdomen is soft.     Tenderness: There is no abdominal tenderness. There is no right CVA tenderness or left CVA tenderness.  Genitourinary:    Comments: No skin lesions.  No testicular swelling.  No lesions on shaft of penis.  There is clear urethral discharge. mild subcoronal hypospadias. Musculoskeletal:     Cervical back: Neck supple.  Skin:    General: Skin is warm and dry.  Neurological:     Mental Status: He is alert.      UC Treatments / Results  Labs (all labs ordered are listed, but only abnormal results are displayed) Labs Reviewed  POCT URINALYSIS DIP (DEVICE) - Abnormal; Notable for the following components:      Result Value   Hgb urine dipstick LARGE (*)    Protein, ur 30 (*)    Leukocytes,Ua SMALL (*)    All other components within normal limits  URINE CULTURE  SARS CORONAVIRUS 2 (TAT 6-24 HRS)  CYTOLOGY, (ORAL, ANAL, URETHRAL) ANCILLARY ONLY    EKG   Radiology No results found.  Procedures Procedures (including critical care time)  Medications Ordered in UC Medications  cefTRIAXone (ROCEPHIN) injection 500 mg (500 mg Intramuscular Given 06/14/19 1057)    Initial Impression / Assessment and Plan / UC Course  I have reviewed the triage vital signs and the nursing notes.  Pertinent labs & imaging results that were available during my care of the patient were reviewed by me and considered in my medical decision making (see chart for details).     #Urethritis #Viral URI with cough Patient is a 29 year old presenting with urethritis symptoms.  UA with blood and leuks.  Given no abdominal or back pain and no history of stones doubt kidney stone.  Will treat with Rocephin and doxycycline.  Urine culture was sent.  Swab for gonorrhea, chlamydia and trichomonas also sent.  Discussed follow-up if not improving over the next 3 to  4 days.  Patient verbalized understanding -Discussed doing a Covid test as patient has only recently within 2 days finished his coronavirus vaccination.  Covid PCR was sent.  Recommend start Zyrtec for nasal signs and symptoms.  Patient agrees. Final Clinical Impressions(s) / UC Diagnoses   Final diagnoses:  Urethritis  Viral URI with cough     Discharge Instructions     Take the doxycycline twice a day for 7 days For your nasal signs and symptoms If no improvement in your painful urination in the next 3 to 4 days please return.  If acutely worsening with fever, abdominal pain or back pain please return.  We have sent lab work and a Museum/gallery exhibitions officer.  We will notify you of any positive results and any results requiring change in your treatment.  Otherwise these will be in your MyChart  If your Covid-19 test is positive, you will receive a phone call from [facility] regarding your results. Negative test results are not called. Both positive and negative results area always visible on MyChart. If you do not have a MyChart account, sign up instructions are in your discharge papers.   Persons who are directed to care for themselves at home may discontinue isolation under the following conditions:  . At least 10 days have passed since symptom onset and . At least 24 hours have passed without running a fever (this means without the use of fever-reducing medications) and . Other symptoms have improved.  Persons infected with COVID-19 who never develop symptoms may discontinue isolation and other precautions 10 days after the date of their first positive COVID-19 test.     ED Prescriptions    Medication Sig Dispense Auth. Provider   doxycycline (VIBRAMYCIN) 100 MG capsule Take 1 capsule (100 mg total) by mouth 2 (two) times daily for 7 days. 14 capsule Bryce Kimble, Veryl Speak, PA-C   cetirizine (ZYRTEC ALLERGY) 10 MG tablet Take 1 tablet (10 mg total) by mouth daily. 30 tablet Daishia Fetterly, Veryl Speak, PA-C      PDMP not reviewed this encounter.   Hermelinda Medicus, PA-C 06/14/19 1837

## 2019-06-16 ENCOUNTER — Telehealth (HOSPITAL_COMMUNITY): Payer: Self-pay

## 2019-06-16 LAB — CYTOLOGY, (ORAL, ANAL, URETHRAL) ANCILLARY ONLY
Chlamydia: NEGATIVE
Comment: NEGATIVE
Comment: NEGATIVE
Comment: NORMAL
Neisseria Gonorrhea: NEGATIVE
Trichomonas: NEGATIVE

## 2019-06-16 LAB — URINE CULTURE: Culture: >=100000 — AB

## 2019-06-16 MED ORDER — CEPHALEXIN 500 MG PO CAPS: 500.0000 mg | ORAL_CAPSULE | Freq: Two times a day (BID) | ORAL | 0 refills | Status: AC

## 2021-09-29 ENCOUNTER — Other Ambulatory Visit: Payer: Self-pay | Admitting: Gastroenterology

## 2021-09-29 DIAGNOSIS — Z8619 Personal history of other infectious and parasitic diseases: Secondary | ICD-10-CM

## 2021-10-04 ENCOUNTER — Ambulatory Visit
Admission: RE | Admit: 2021-10-04 | Discharge: 2021-10-04 | Disposition: A | Discharge status: Home / Self Care | Payer: BC Managed Care – PPO | Source: Ambulatory Visit | Attending: Gastroenterology | Admitting: Gastroenterology

## 2021-10-04 DIAGNOSIS — Z8619 Personal history of other infectious and parasitic diseases: Secondary | ICD-10-CM

## 2021-10-17 ENCOUNTER — Emergency Department: Payer: BC Managed Care – PPO

## 2021-10-17 ENCOUNTER — Other Ambulatory Visit: Payer: Self-pay

## 2021-10-17 DIAGNOSIS — S838X1A Sprain of other specified parts of right knee, initial encounter: Secondary | ICD-10-CM | POA: Diagnosis not present

## 2021-10-17 DIAGNOSIS — X501XXA Overexertion from prolonged static or awkward postures, initial encounter: Secondary | ICD-10-CM | POA: Diagnosis not present

## 2021-10-17 DIAGNOSIS — S8991XA Unspecified injury of right lower leg, initial encounter: Secondary | ICD-10-CM | POA: Diagnosis present

## 2021-10-17 NOTE — ED Triage Notes (Signed)
Pt presents via POV c/o right knee pain s/p injury at work x2 weeks ago. Pt ambulatory to triage.

## 2021-10-17 NOTE — ED Notes (Signed)
Pt declines wanting to file worker's comp.

## 2021-10-18 ENCOUNTER — Emergency Department
Admission: EM | Admit: 2021-10-18 | Discharge: 2021-10-18 | Disposition: A | Discharge status: Home / Self Care | Payer: BC Managed Care – PPO | Attending: Emergency Medicine | Admitting: Emergency Medicine

## 2021-10-18 DIAGNOSIS — M25561 Pain in right knee: Secondary | ICD-10-CM

## 2021-10-18 DIAGNOSIS — S8991XA Unspecified injury of right lower leg, initial encounter: Secondary | ICD-10-CM

## 2021-10-18 DIAGNOSIS — S838X1A Sprain of other specified parts of right knee, initial encounter: Secondary | ICD-10-CM

## 2021-10-18 NOTE — ED Provider Notes (Signed)
[facility] Provider Note    Event Date/Time   First MD Initiated Contact with Patient 10/18/21 0142     (approximate)   History   Knee Injury   HPI  Ernest Moore is a 31 y.o. male who presents to the ED for evaluation of Knee Injury   Patient reports an injury to his right knee that occurred nearly 2 weeks ago at work.  Reports he was standing when his knee got pulled out in front of him and twisted.  Reports pain despite icing and heat since that time.  Wants to get checked out because it still hurting.  No further injuries.   Physical Exam   Triage Vital Signs: ED Triage Vitals  Enc Vitals Group     BP 10/17/21 2204 118/78     Pulse Rate 10/17/21 2204 61     Resp 10/17/21 2204 14     Temp 10/17/21 2204 98.4 F (36.9 C)     Temp Source 10/17/21 2204 Oral     SpO2 10/17/21 2204 100 %     Weight 10/17/21 2205 135 lb (61.2 kg)     Height 10/17/21 2205 5\' 6"  (1.676 m)     Head Circumference --      Peak Flow --      Pain Score 10/17/21 2205 5     Pain Loc --      Pain Edu? --      Excl. in GC? --     Most recent vital signs: Vitals:   10/17/21 2204  BP: 118/78  Pulse: 61  Resp: 14  Temp: 98.4 F (36.9 C)  SpO2: 100%    General: Awake, no distress.  CV:  Good peripheral perfusion.  Resp:  Normal effort.  Abd:  No distention.  MSK:  No deformity noted.  No swelling or signs of effusion.  Has a healing abrasion to the lateral aspect of the right knee.  Lachman and anterior drawer negative.  Thessaly test is positive.  No pain with valgus or varus maneuvers Neuro:  No focal deficits appreciated. Cranial nerves II through XII intact 5/5 strength and sensation in all 4 extremities Other:     ED Results / Procedures / Treatments   Labs (all labs ordered are listed, but only abnormal results are displayed) Labs Reviewed - No data to display  EKG   RADIOLOGY Plain film of the right knee interpreted by me without evidence of  fracture or dislocation.  Official radiology report(s): DG Knee Right Port  Result Date: 10/17/2021 CLINICAL DATA:  Status post trauma 2 weeks ago. EXAM: PORTABLE RIGHT KNEE - 1-2 VIEW COMPARISON:  None Available. FINDINGS: No evidence of fracture, dislocation, or joint effusion. No evidence of arthropathy or other focal bone abnormality. Soft tissues are unremarkable. IMPRESSION: Negative. Electronically Signed   By: 10/19/2021 M.D.   On: 10/17/2021 22:53    PROCEDURES and INTERVENTIONS:  Procedures  Medications - No data to display   IMPRESSION / MDM / ASSESSMENT AND PLAN / ED COURSE  I reviewed the triage vital signs and the nursing notes.  Differential diagnosis includes, but is not limited to, fracture, dislocation, arthritis, meniscus injury, MCL injury, LCL injury  {Patient presents with symptoms of an acute illness or injury that is potentially life-threatening.  Otherwise healthy 31 year old presents to the ED with evidence of possible meniscus injury.  X-rays reassuring.  Exam findings with positive Thessaly maneuver concerning for meniscus injury.  We discussed RICE  therapies and orthopedic follow-up.      FINAL CLINICAL IMPRESSION(S) / ED DIAGNOSES   Final diagnoses:  Acute pain of right knee  Injury of right knee, initial encounter  Sprain of medial meniscus of right knee, initial encounter     Rx / DC Orders   ED Discharge Orders     None        Note:  This document was prepared using Dragon voice recognition software and may include unintentional dictation errors.   Delton Prairie, MD 10/18/21 [phone]

## 2021-10-18 NOTE — Discharge Instructions (Addendum)
Please take Tylenol and ibuprofen/Advil for your pain.  It is safe to take them together, or to alternate them every few hours.  Take up to 1000mg of Tylenol at a time, up to 4 times per day.  Do not take more than 4000 mg of Tylenol in 24 hours.  For ibuprofen, take 400-600 mg, 3 - 4 times per day.  

## 2021-11-14 ENCOUNTER — Other Ambulatory Visit: Payer: Self-pay | Admitting: Orthopedic Surgery

## 2021-11-14 ENCOUNTER — Other Ambulatory Visit (HOSPITAL_COMMUNITY): Payer: Self-pay | Admitting: Orthopedic Surgery

## 2021-11-14 DIAGNOSIS — M21371 Foot drop, right foot: Secondary | ICD-10-CM

## 2021-11-14 DIAGNOSIS — M25561 Pain in right knee: Secondary | ICD-10-CM

## 2021-11-14 DIAGNOSIS — S8411XA Injury of peroneal nerve at lower leg level, right leg, initial encounter: Secondary | ICD-10-CM

## 2021-11-14 DIAGNOSIS — S8001XA Contusion of right knee, initial encounter: Secondary | ICD-10-CM

## 2021-11-23 ENCOUNTER — Ambulatory Visit
Admission: RE | Admit: 2021-11-23 | Discharge: 2021-11-23 | Disposition: A | Discharge status: Home / Self Care | Payer: BC Managed Care – PPO | Source: Ambulatory Visit | Attending: Orthopedic Surgery | Admitting: Orthopedic Surgery

## 2021-11-23 DIAGNOSIS — M21371 Foot drop, right foot: Secondary | ICD-10-CM | POA: Insufficient documentation

## 2021-11-23 DIAGNOSIS — M25561 Pain in right knee: Secondary | ICD-10-CM | POA: Diagnosis present

## 2021-11-23 DIAGNOSIS — S8411XA Injury of peroneal nerve at lower leg level, right leg, initial encounter: Secondary | ICD-10-CM | POA: Diagnosis present

## 2021-11-23 DIAGNOSIS — S8001XA Contusion of right knee, initial encounter: Secondary | ICD-10-CM | POA: Diagnosis present

## 2023-03-17 ENCOUNTER — Emergency Department: Payer: BC Managed Care – PPO

## 2023-03-17 ENCOUNTER — Emergency Department
Admission: EM | Admit: 2023-03-17 | Discharge: 2023-03-17 | Disposition: A | Discharge status: Home / Self Care | Payer: BC Managed Care – PPO | Attending: Emergency Medicine | Admitting: Emergency Medicine

## 2023-03-17 ENCOUNTER — Other Ambulatory Visit: Payer: Self-pay

## 2023-03-17 ENCOUNTER — Encounter: Payer: Self-pay | Admitting: Intensive Care

## 2023-03-17 DIAGNOSIS — R509 Fever, unspecified: Secondary | ICD-10-CM | POA: Insufficient documentation

## 2023-03-17 DIAGNOSIS — R051 Acute cough: Secondary | ICD-10-CM | POA: Insufficient documentation

## 2023-03-17 DIAGNOSIS — R112 Nausea with vomiting, unspecified: Secondary | ICD-10-CM | POA: Insufficient documentation

## 2023-03-17 DIAGNOSIS — Z20822 Contact with and (suspected) exposure to covid-19: Secondary | ICD-10-CM | POA: Diagnosis not present

## 2023-03-17 LAB — COMPREHENSIVE METABOLIC PANEL
ALT: 32 U/L (ref 0–44)
AST: 35 U/L (ref 15–41)
Albumin: 4.4 g/dL (ref 3.5–5.0)
Alkaline Phosphatase: 65 U/L (ref 38–126)
Anion gap: 13 (ref 5–15)
BUN: 20 mg/dL (ref 6–20)
CO2: 24 mmol/L (ref 22–32)
Calcium: 8.8 mg/dL — ABNORMAL LOW (ref 8.9–10.3)
Chloride: 106 mmol/L (ref 98–111)
Creatinine, Ser: 0.89 mg/dL (ref 0.61–1.24)
GFR, Estimated: >60 mL/min (ref >60)
Glucose, Bld: 109 mg/dL — ABNORMAL HIGH (ref 70–99)
Potassium: 4.2 mmol/L (ref 3.5–5.1)
Sodium: 143 mmol/L (ref 135–145)
Total Bilirubin: 1 mg/dL (ref 0.0–1.2)
Total Protein: 7.1 g/dL (ref 6.5–8.1)

## 2023-03-17 LAB — CBC WITH DIFFERENTIAL/PLATELET
Abs Immature Granulocytes: 0.02 10*3/uL (ref 0.00–0.07)
Basophils Absolute: 0 10*3/uL (ref 0.0–0.1)
Basophils Relative: 0 %
Eosinophils Absolute: 0 10*3/uL (ref 0.0–0.5)
Eosinophils Relative: 0 %
HCT: 42.1 % (ref 39.0–52.0)
Hemoglobin: 14.5 g/dL (ref 13.0–17.0)
Immature Granulocytes: 0 %
Lymphocytes Relative: 4 %
Lymphs Abs: 0.3 10*3/uL — ABNORMAL LOW (ref 0.7–4.0)
MCH: 30 pg (ref 26.0–34.0)
MCHC: 34.4 g/dL (ref 30.0–36.0)
MCV: 87.2 fL (ref 80.0–100.0)
Monocytes Absolute: 0.2 10*3/uL (ref 0.1–1.0)
Monocytes Relative: 3 %
Neutro Abs: 7.7 10*3/uL (ref 1.7–7.7)
Neutrophils Relative %: 93 %
Platelets: 107 10*3/uL — ABNORMAL LOW (ref 150–400)
RBC: 4.83 MIL/uL (ref 4.22–5.81)
RDW: 12 % (ref 11.5–15.5)
Smear Review: NORMAL
WBC: 8.3 10*3/uL (ref 4.0–10.5)
nRBC: 0 % (ref 0.0–0.2)

## 2023-03-17 LAB — LIPASE, BLOOD: Lipase: 28 U/L (ref 11–51)

## 2023-03-17 LAB — RESP PANEL BY RT-PCR (RSV, FLU A&B, COVID)  RVPGX2
Influenza A by PCR: NEGATIVE
Influenza B by PCR: NEGATIVE
Resp Syncytial Virus by PCR: NEGATIVE
SARS Coronavirus 2 by RT PCR: NEGATIVE

## 2023-03-17 MED ORDER — ONDANSETRON 4 MG PO TBDP: 4.0000 mg | ORAL_TABLET | Freq: Three times a day (TID) | ORAL | 0 refills | Status: AC | PRN

## 2023-03-17 MED ORDER — ACETAMINOPHEN 325 MG PO TABS
650.0000 mg | ORAL_TABLET | Freq: Once | ORAL | Status: AC | PRN
  Administered 2023-03-17: 650 mg via ORAL
  Filled 2023-03-17: qty 2

## 2023-03-17 NOTE — Discharge Instructions (Signed)
Please take the Zofran as prescribed for your nausea vomiting.  Please make sure to keep yourself hydrated and wash your hands before you touch food or cough or sneeze or blow your nose or go to the bathroom, do not share food with your family members.  He likely will need to stay out of work till has been 24 hours since your last fever (without medications given).  You can take 500 mg of Tylenol or 400 mg of ibuprofen every 6 hours as needed for fever.

## 2023-03-17 NOTE — ED Provider Triage Note (Signed)
Emergency Medicine Provider Triage Evaluation Note  Ernest Moore , a 33 y.o. male  was evaluated in triage.  Pt complains of lightheadedness, n/v/d and cough that began today. No pain or fever.  Review of Systems  Positive: Dizzy, n/v/d, cough Negative: Abd pan, chest pain, sob, fever  Physical Exam  There were no vitals taken for this visit. Gen:   Awake, no distress   Resp:  Normal effort  MSK:   Moves extremities without difficulty  Other:    Medical Decision Making  Medically screening exam initiated at 4:29 PM.  Appropriate orders placed.  Ernest Moore was informed that the remainder of the evaluation will be completed by another provider, this initial triage assessment does not replace that evaluation, and the importance of remaining in the ED until their evaluation is complete.     Jackelyn Hoehn, PA-C 03/17/23 1630

## 2023-03-17 NOTE — ED Provider Notes (Signed)
[facility] Provider Note    Event Date/Time   First MD Initiated Contact with Patient 03/17/23 1940     (approximate)   History   Dizziness   HPI  Ernest Moore is a 33 y.o. male presenting with nausea vomiting and some lightheadedness.  States that he has not been able to really tolerate p.o.  Had a fever in the emergency department.  He denies any recent travel, no other sick contacts.  States that he has a mild cough.  No abdominal pain, urinary symptoms.       Physical Exam   Triage Vital Signs: ED Triage Vitals [03/17/23 1629]  Encounter Vitals Group     BP 117/70     Systolic BP Percentile      Diastolic BP Percentile      Pulse Rate 82     Resp 16     Temp (!) 101.8 F (38.8 C)     Temp Source Oral     SpO2 100 %     Weight 143 lb (64.9 kg)     Height 5\' 7"  (1.702 m)     Head Circumference      Peak Flow      Pain Score 8     Pain Loc      Pain Education      Exclude from Growth Chart     Most recent vital signs: Vitals:   03/17/23 1629 03/17/23 2052  BP: 117/70 114/67  Pulse: 82 71  Resp: 16 19  Temp: (!) 101.8 F (38.8 C) 98.3 F (36.8 C)  SpO2: 100% 100%     General: Awake, no distress.  CV:  Good peripheral perfusion.  Resp:  Normal effort.  Abd:  No distention.  Soft nontender Other:  No focal weakness or numbness, steady ambulation without any ataxia.   ED Results / Procedures / Treatments   Labs (all labs ordered are listed, but only abnormal results are displayed) Labs Reviewed  COMPREHENSIVE METABOLIC PANEL - Abnormal; Notable for the following components:      Result Value   Glucose, Bld 109 (*)    Calcium 8.8 (*)    All other components within normal limits  CBC WITH DIFFERENTIAL/PLATELET - Abnormal; Notable for the following components:   Platelets 107 (*)    Lymphs Abs 0.3 (*)    All other components within normal limits  RESP PANEL BY RT-PCR (RSV, FLU A&B, COVID)  RVPGX2  LIPASE, BLOOD      EKG  Normal sinus rhythm, rate 80, normal QRS, normal QTc, baseline is wandering but no obvious ST elevations, T wave inversion to V5 and V6, no prior to compare   RADIOLOGY Chest x-ray on my interpretation without focal consolidation   PROCEDURES:  Critical Care performed: No  Procedures   MEDICATIONS ORDERED IN ED: Medications  acetaminophen (TYLENOL) tablet 650 mg (650 mg Oral Given 03/17/23 1638)     IMPRESSION / MDM / ASSESSMENT AND PLAN / ED COURSE  I reviewed the triage vital signs and the nursing notes.                              Differential diagnosis includes, but is not limited to, norovirus, COVID, influenza, RSV, gastroenteritis, for his lightheadedness, suspect dehydration, electrolyte derangements.  Will get labs, respiratory panel, chest x-ray.  Patient is not nauseous at this time, will p.o. hydrate as well as given  some Tylenol.  Patient's presentation is most consistent with acute presentation with potential threat to life or bodily function.  Independent review of labs, respiratory virus panel is negative, LFTs are not elevated, electrolytes no severely deranged, no leukocytosis, he has a platelet count of 107 but but his platelets tend to be on the low side when compared to prior labs.  Chest x-ray without focal consolidation.  On reassessment patient is feeling a lot better, tolerating p.o.  Wants to go home.  Extensive discussion with patient and wife since they have a 49-week-old at home, strict to him to isolate from 51-week-old, discussed washing hands, keeping food separate and other isolation measures.  Will give him a work note.  Considered but no indication for inpatient admission at this time, he is safe for outpatient management.  Will discharge with strict precautions.  Clinical Course as of 03/17/23 2209  Sat Mar 17, 2023  2120 DG Chest 2 View No active cardiopulmonary disease.  [TT]    Clinical Course User Index [TT] Claybon Jabs, MD      FINAL CLINICAL IMPRESSION(S) / ED DIAGNOSES   Final diagnoses:  Nausea and vomiting, unspecified vomiting type  Fever, unspecified fever cause  Acute cough     Rx / DC Orders   ED Discharge Orders          Ordered    ondansetron (ZOFRAN-ODT) 4 MG disintegrating tablet  Every 8 hours PRN        03/17/23 2204             Note:  This document was prepared using Dragon voice recognition software and may include unintentional dictation errors.    Claybon Jabs, MD 03/17/23 2209

## 2023-03-17 NOTE — ED Triage Notes (Signed)
Patient c/o emesis, dizziness, and cough that started today. Fever present in triage.

## 2023-09-03 ENCOUNTER — Encounter (HOSPITAL_BASED_OUTPATIENT_CLINIC_OR_DEPARTMENT_OTHER): Payer: Self-pay | Admitting: *Deleted

## 2023-09-03 ENCOUNTER — Emergency Department (HOSPITAL_BASED_OUTPATIENT_CLINIC_OR_DEPARTMENT_OTHER)
Admission: EM | Admit: 2023-09-03 | Discharge: 2023-09-03 | Disposition: A | Discharge status: Home / Self Care | Attending: Emergency Medicine | Admitting: Emergency Medicine

## 2023-09-03 ENCOUNTER — Other Ambulatory Visit: Payer: Self-pay

## 2023-09-03 ENCOUNTER — Emergency Department (HOSPITAL_BASED_OUTPATIENT_CLINIC_OR_DEPARTMENT_OTHER): Admitting: Radiology

## 2023-09-03 DIAGNOSIS — M545 Low back pain, unspecified: Secondary | ICD-10-CM | POA: Insufficient documentation

## 2023-09-03 DIAGNOSIS — X500XXA Overexertion from strenuous movement or load, initial encounter: Secondary | ICD-10-CM | POA: Insufficient documentation

## 2023-09-03 MED ORDER — KETOROLAC TROMETHAMINE 30 MG/ML IJ SOLN
30.0000 mg | Freq: Once | INTRAMUSCULAR | Status: AC
  Administered 2023-09-03: 30 mg via INTRAMUSCULAR
  Filled 2023-09-03: qty 1

## 2023-09-03 MED ORDER — CYCLOBENZAPRINE HCL 10 MG PO TABS: 10.0000 mg | ORAL_TABLET | Freq: Two times a day (BID) | ORAL | 0 refills | Status: DC | PRN

## 2023-09-03 MED ORDER — KETOROLAC TROMETHAMINE 10 MG PO TABS: 10.0000 mg | ORAL_TABLET | Freq: Four times a day (QID) | ORAL | 0 refills | Status: AC | PRN

## 2023-09-03 MED ORDER — CYCLOBENZAPRINE HCL 10 MG PO TABS
10.0000 mg | ORAL_TABLET | Freq: Once | ORAL | Status: AC
  Administered 2023-09-03: 10 mg via ORAL
  Filled 2023-09-03: qty 1

## 2023-09-03 MED ORDER — DIAZEPAM 5 MG PO TABS: 5.0000 mg | ORAL_TABLET | Freq: Two times a day (BID) | ORAL | 0 refills | Status: AC | PRN

## 2023-09-03 NOTE — ED Notes (Signed)
 Pt d/c instructions, medications, and follow-up care reviewed with pt. Pt verbalized understanding and had no further questions at time of d/c. Pt CA&Ox4, ambulatory, and in NAD at time of d/c

## 2023-09-03 NOTE — ED Triage Notes (Signed)
 Patient to ED reporting he was dead lifting when he felt a sudden onset of left sided lower back pain. 3 days ago. Over the counter medication, ice, Epson salt baths, and rest have not been helping at home. Patient able to ambulate in triage slowly.

## 2023-09-03 NOTE — Discharge Instructions (Addendum)
 Your x-ray did not show any obvious fracture or dislocation.  Will send you home with a couple different medicines to treat your symptoms.  Recommend performing the rehab exercises at home.  Please do not hesitate to return to the emergency department if the worrisome signs and symptoms we discussed become apparent.

## 2023-09-03 NOTE — ED Provider Notes (Signed)
 [place] [facility]GENCY [facility]PART[facility] AT [facility] Provider Note   CSN: [id number] Arrival date & time: 09/03/23  1125     Patient presents with: Back Pain   Ernest Moore is a 33 y.o. male.    Back Pain   33 year old male presents emergency department with complaints of back pain.  States the back pain began 3 days ago when he was doing dead lifts.  States that he felt a pull/popping sensation in his low back.  States since then, has had pain mainly in his low back but with some pain in the middle of his back.  Has tried multiple over-the-counter medications without significant improvement in symptoms prompting visit to the emergency department.  Denies any saddle anesthesia, bowel/bladder dysfunction, fever, history of IV drug use, prolonged corticosteroid use, no malignancy.  Denies any urinary symptoms, abdominal pain.  Past medical history significant for hepatitis B  Prior to Admission medications   Medication Sig Start Date End Date Taking? Authorizing Provider  cetirizine  (ZYRTEC  ALLERGY) 10 MG tablet Take 1 tablet (10 mg total) by mouth daily. 06/14/19   Darr, Jacob, PA-C  ibuprofen (ADVIL,MOTRIN) 200 MG tablet Take 400 mg by mouth every 6 (six) hours as needed for mild pain or moderate pain.    Provider, Historical, Ernest Moore  ondansetron  (ZOFRAN -ODT) 4 MG disintegrating tablet Take 1 tablet (4 mg total) by mouth every 8 (eight) hours as needed for nausea or vomiting. 03/17/23   Waymond Lorelle Cummins, Ernest Moore  Phenylephrine-Pheniramine-DM [facility] COLD & COUGH PO) Take by mouth.    Provider, Historical, Ernest Moore    Allergies: Patient has no known allergies.    Review of Systems  Musculoskeletal:  Positive for back pain.  All other systems reviewed and are negative.   Updated Vital Signs BP 124/73 (BP Location: Left Arm)   Pulse 66   Temp 98.2 F (36.8 C) (Oral)   Resp 14   SpO2 100%   Physical Exam Vitals and nursing note reviewed.  Constitutional:      General: He is not in acute  distress.    Appearance: He is well-developed.  HENT:     Head: Normocephalic and atraumatic.  Eyes:     Conjunctiva/sclera: Conjunctivae normal.  Cardiovascular:     Rate and Rhythm: Normal rate and regular rhythm.     Heart sounds: No murmur heard. Pulmonary:     Effort: Pulmonary effort is normal. No respiratory distress.     Breath sounds: Normal breath sounds.  Abdominal:     Palpations: Abdomen is soft.     Tenderness: There is no abdominal tenderness. There is no right CVA tenderness or left CVA tenderness.  Musculoskeletal:        General: No swelling.     Cervical back: Neck supple.     Comments: Midline tenderness L4-L5 lumbar spine without step-off deformity.  Paraspinal tenderness in the left lower lumbar region with tenderness into left buttock.  Straight leg raise negative bilaterally.  Muscular strength 5-5 bilateral lower extremities.  DTR symmetric at patella.  Pedal posttibial pulses 2+ bilaterally.  Skin:    General: Skin is warm and dry.     Capillary Refill: Capillary refill takes less than 2 seconds.  Neurological:     Mental Status: He is alert.  Psychiatric:        Mood and Affect: Mood normal.     (all labs ordered are listed, but only abnormal results are displayed) Labs Reviewed - No data to display  EKG: None  Radiology: No results found.   Procedures   Medications Ordered in the ED - No data to display                                  Medical Decision Making Amount and/or Complexity of Data Reviewed Radiology: ordered.  Risk Prescription drug management.   This patient presents to the ED for concern of back pain, this involves an extensive number of treatment options, and is a complaint that carries with it a high risk of complications and morbidity.  The differential diagnosis includes fracture, strain/pain, dislocation, pyonephritis, nephrolithiasis, AAA, aortic dissection, cauda equina, spinal epidural abscess, other spinal cord  compression/impingement, other   Co morbidities that complicate the patient evaluation  See HPI   Additional history obtained:  Additional history obtained from EMR External records from outside source obtained and reviewed including hospital records   Lab Tests:  N/a   Imaging Studies ordered:  I ordered imaging studies including lumbar spine x-ray I independently visualized and interpreted imaging which showed negative I agree with the radiologist interpretation   Cardiac Monitoring: / EKG:  N/a   Consultations Obtained:  N/a   Problem List / ED Course / Critical interventions / Medication management  Low back pain I ordered medication including Toradol , Flexeril    Reevaluation of the patient after these medicines showed that the patient improved I have reviewed the patients home medicines and have made adjustments as needed   Social Determinants of Health:  Denies tobacco, licit drug use.   Test / Admission - Considered:  Low back pain Vitals signs within normal range and stable throughout visit. Imaging studies significant for: See above 33 year old male presents emergency department with complaints of back pain.  States the back pain began 3 days ago when he was doing dead lifts.  States that he felt a pull/popping sensation in his low back.  States since then, has had pain mainly in his low back but with some pain in the middle of his back.  Has tried multiple over-the-counter medications without significant improvement in symptoms prompting visit to the emergency department.  Denies any saddle anesthesia, bowel/bladder dysfunction, fever, history of IV drug use, prolonged corticosteroid use, no malignancy.  Denies any urinary symptoms, abdominal pain. On exam, paraspinal tenderness noted in the left lumbar region with some midline tenderness.  No red flag signs for back pain HPI/PE; low suspicion for cauda equina, spinal epidural abscess, other spinal  cord compression/impingement.  Patient without radiating pain to the abdomen, urinary symptoms; low suspicion for pyelonephritis, nephrolithiasis.  Patient without abdominal pain rating to back, pulse deficits, neurodeficits, hypertension; low suspicion for aortic dissection.  Given some midline tenderness as well as hearing popping when injury occurred, x-ray imaging was performed which was negative.  Treated with medications in the ED and did note improvement.  Suspect muscular injury.  Will recommend symptomatic therapy as described in AVS with close follow-up with PCP in the outpatient setting for reassessment.  Treatment plan discussed with patient and he acknowledged understanding was agreeable to said plan.  Patient overall well-appearing, afebrile in no acute distress. Worrisome signs and symptoms were discussed with the patient, and the patient acknowledged understanding to return to the ED if noticed. Patient was stable upon discharge. '     Final diagnoses:  None    ED Discharge Orders     None  Ernest Moore LABOR, Ernest Moore 09/03/23 1356    Ernest Sid SAILOR, Ernest Moore 09/03/23 [phone]
